# Patient Record
Sex: Male | Born: 2000 | Race: White | Hispanic: No | Marital: Married | Smoking: Current every day smoker
Health system: Southern US, Community
[De-identification: ages and names within clinical notes are randomized; demographics above are authoritative.]

## PROBLEM LIST (undated history)

## (undated) DIAGNOSIS — F909 Attention-deficit hyperactivity disorder, unspecified type: Secondary | ICD-10-CM

## (undated) DIAGNOSIS — R12 Heartburn: Secondary | ICD-10-CM

## (undated) DIAGNOSIS — M549 Dorsalgia, unspecified: Secondary | ICD-10-CM

## (undated) HISTORY — PX: OTHER SURGICAL HISTORY: SHX169

## (undated) HISTORY — DX: Attention-deficit hyperactivity disorder, unspecified type: F90.9

## (undated) HISTORY — DX: Dorsalgia, unspecified: M54.9

---
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
## 2019-09-14 IMAGING — DX LEFT WRIST - COMPLETE 3+ VIEW
2 series · 2 of 2 positions shown · non-contrast
Comparison: None.

CLINICAL DATA: Laceration to LEFT wrist. Foreign body?

EXAM:
LEFT WRIST - COMPLETE 3+ VIEW

[wrist ap]
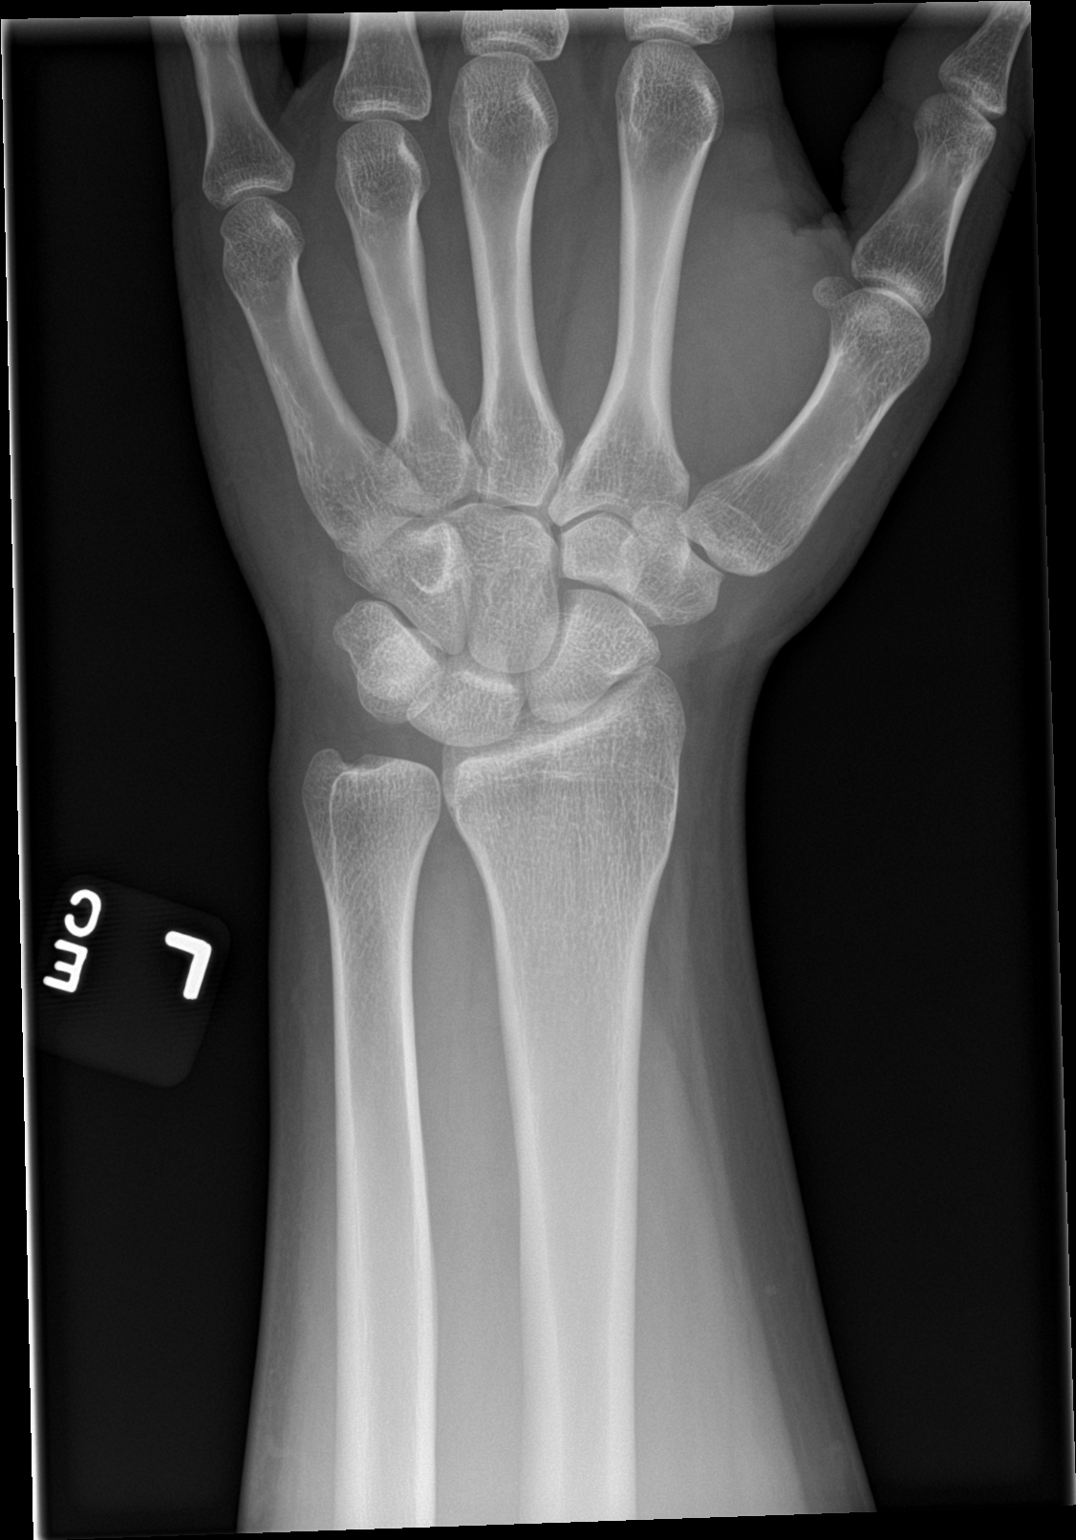

[wrist lat]
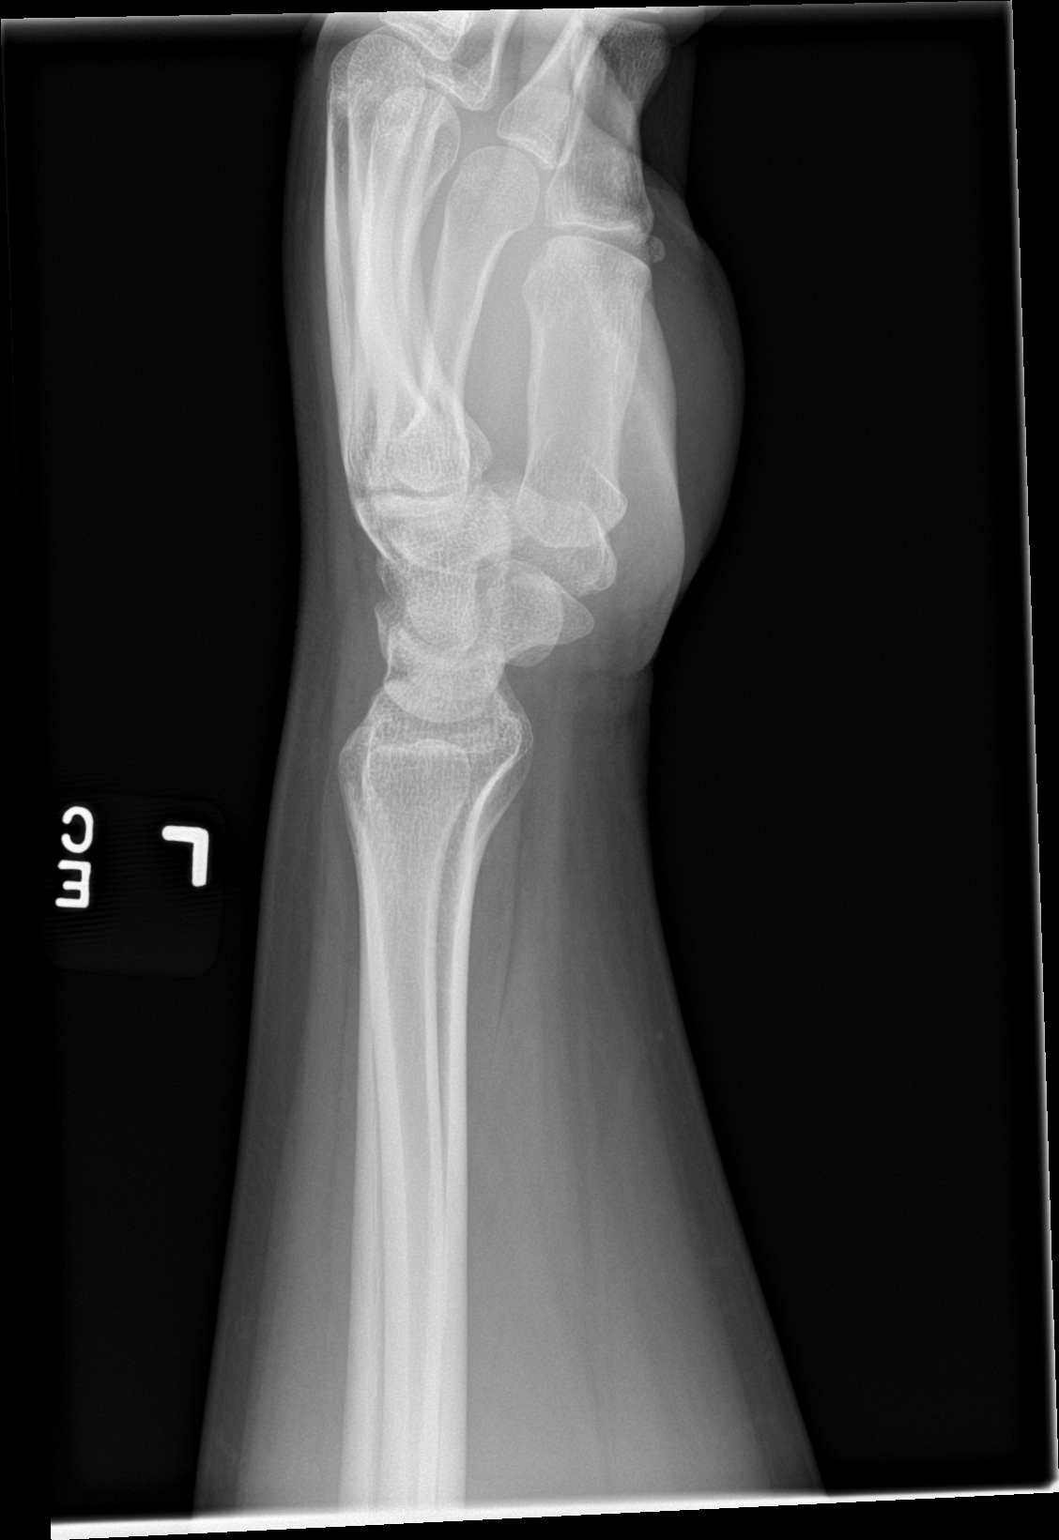

[2 of 2 positions shown; findings below may reference images not displayed]

FINDINGS: Osseous alignment is normal. No fracture line or displaced fracture
fragment seen. No radiodense foreign body appreciated within the
surrounding soft tissues.
IMPRESSION: 1. Negative.
2. No radiopaque foreign body.

## 2021-03-06 ENCOUNTER — Ambulatory Visit: Payer: Managed Care, Other (non HMO) | Admitting: Internal Medicine

## 2021-03-06 ENCOUNTER — Other Ambulatory Visit: Payer: Self-pay

## 2021-03-06 ENCOUNTER — Encounter: Payer: Self-pay | Admitting: Internal Medicine

## 2021-03-06 VITALS — BP 126/78 | HR 87 | Temp 98.2°F | Resp 16 | Ht 68.5 in | Wt 212.7 lb

## 2021-03-06 DIAGNOSIS — K219 Gastro-esophageal reflux disease without esophagitis: Secondary | ICD-10-CM

## 2021-03-06 DIAGNOSIS — B353 Tinea pedis: Secondary | ICD-10-CM

## 2021-03-06 MED ORDER — PANTOPRAZOLE SODIUM 40 MG PO TBEC: 40.0000 mg | DELAYED_RELEASE_TABLET | Freq: Every day | ORAL | 3 refills | Status: DC

## 2021-03-06 MED ORDER — ONDANSETRON HCL 8 MG PO TABS: 8.0000 mg | ORAL_TABLET | Freq: Three times a day (TID) | ORAL | 1 refills | Status: DC | PRN

## 2021-03-06 MED ORDER — TERBINAFINE HCL 1 % EX CREA: 1.0000 "application " | TOPICAL_CREAM | Freq: Two times a day (BID) | CUTANEOUS | 0 refills | Status: DC

## 2021-03-06 NOTE — Patient Instructions (Addendum)
It was great seeing you today!  Plan discussed at today's visit: -Recommend preparation H for hemorrhoids  -Pantoprazole 40 mg sent to pharmacy  -Cream for foot sent to pharmacy   Follow up in: 1 month  Take care and let us know if you have any questions or concerns prior to your next visit.  Dr. Caralee Ates   Food Choices for Gastroesophageal Reflux Disease, Adult When you have gastroesophageal reflux disease (GERD), the foods you eat and your eating habits are very important. Choosing the right foods can help ease your discomfort. Think about working with a food expert (dietitian) to help you make good choices. What are tips for following this plan? Reading food labels Look for foods that are low in saturated fat. Foods that may help with your symptoms include: Foods that have less than 5% of daily value (DV) of fat. Foods that have 0 grams of trans fat. Cooking Do not fry your food. Cook your food by baking, steaming, grilling, or broiling. These are all methods that do not need a lot of fat for cooking. To add flavor, try to use herbs that are low in spice and acidity. Meal planning Choose healthy foods that are low in fat, such as: Fruits and vegetables. Whole grains. Low-fat dairy products. Lean meats, fish, and poultry. Eat small meals often instead of eating 3 large meals each day. Eat your meals slowly in a place where you are relaxed. Avoid bending over or lying down until 2-3 hours after eating. Limit high-fat foods such as fatty meats or fried foods. Limit your intake of fatty foods, such as oils, butter, and shortening. Avoid the following as told by your doctor: Foods that cause symptoms. These may be different for different people. Keep a food diary to keep track of foods that cause symptoms. Alcohol. Drinking a lot of liquid with meals. Eating meals during the 2-3 hours before bed. Lifestyle Stay at a healthy weight. Ask your doctor what weight is healthy for you.  If you need to lose weight, work with your doctor to do so safely. Exercise for at least 30 minutes on 5 or more days each week, or as told by your doctor. Wear loose-fitting clothes. Do not smoke or use any products that contain nicotine or tobacco. If you need help quitting, ask your doctor. Sleep with the head of your bed higher than your feet. Use a wedge under the mattress or blocks under the bed frame to raise the head of the bed. Chew sugar-free gum after meals. What foods should eat? Eat a healthy, well-balanced diet of fruits, vegetables, whole grains, low-fat dairy products, lean meats, fish, and poultry. Each person is different. Foods that may cause symptoms in one person may not cause any symptoms in another person. Work with your doctor to find foods that are safe for you. The items listed above may not be a complete list of what you can eat and drink. Contact a food expert for more options. What foods should I avoid? Limiting some of these foods may help in managing the symptoms of GERD. Everyone is different. Talk with a food expert or your doctor to help you find the exact foods to avoid, if any. Fruits Any fruits prepared with added fat. Any fruits that cause symptoms. For some people, this may include citrus fruits, such as oranges, grapefruit, pineapple, and lemons. Vegetables Deep-fried vegetables. [place] fries. Any vegetables prepared with added fat. Any vegetables that cause symptoms. For some people, this  may include tomatoes and tomato products, chili peppers, onions and garlic, and horseradish. Grains Pastries or quick breads with added fat. Meats and other proteins High-fat meats, such as fatty beef or pork, hot dogs, ribs, ham, sausage, salami, and bacon. Fried meat or protein, including fried fish and fried chicken. Nuts and nut butters, in large amounts. Dairy Whole milk and chocolate milk. Sour cream. Cream. Ice cream. Cream cheese. Milkshakes. Fats and  oils Butter. Margarine. Shortening. Ghee. Beverages Coffee and tea, with or without caffeine. Carbonated beverages. Sodas. Energy drinks. Fruit juice made with acidic fruits, such as orange or grapefruit. Tomato juice. Alcoholic drinks. Sweets and desserts Chocolate and cocoa. Donuts. Seasonings and condiments Pepper. Peppermint and spearmint. Added salt. Any condiments, herbs, or seasonings that cause symptoms. For some people, this may include curry, hot sauce, or vinegar-based salad dressings. The items listed above may not be a complete list of what you should not eat and drink. Contact a food expert for more options. Questions to ask your doctor Diet and lifestyle changes are often the first steps that are taken to manage symptoms of GERD. If diet and lifestyle changes do not help, talk with your doctor about taking medicines. Where to find more information [employer] for Gastrointestinal [employer]: aboutgerd.org Summary When you have GERD, food and lifestyle choices are very important in easing your symptoms. Eat small meals often instead of 3 large meals a day. Eat your meals slowly and in a place where you are relaxed. Avoid bending over or lying down until 2-3 hours after eating. Limit high-fat foods such as fatty meats or fried foods. This information is not intended to replace advice given to you by your health care provider. Make sure you discuss any questions you have with your health care provider. Document Revised: 09/21/2019 Document Reviewed: 09/21/2019 Elsevier Patient Education  2022 [employer].

## 2021-03-06 NOTE — Progress Notes (Signed)
New Patient Office Visit  Subjective:  Patient ID: Garrett [name], male    DOB: Aug 21, 2000  Age: 20 y.o. MRN: [id number]  CC:  Chief Complaint  Patient presents with   Establish Care   Gastroesophageal Reflux    Stomach pain, vomiting in am w/ nausea   Back Pain    sciatica    HPI Garrett [name] presents as a new patient. Nauseous, vomiting dark yellow bile with burning pain, worse in the morning but does happen throughout the day, happening x 1 month. Has been taking Zofran and Promethazine, has helped nausea but not burning pain. Tried Tums, H2 blockers in the past.   Abdominal Complaint: -Duration: chronic since age 83, but worse recently -Frequency: a few times a day -Nature: bloating and burning -Location: diffuse  -Severity: mild  -Radiation: no -Alleviating factors: Nothing -Aggravating factors: Empty stomach -Treatments attempted: antacids, PPI, and H2 Blocker -Constipation: no -Diarrhea: no --Heartburn: yes -Bloating:yes -Nausea: yes -Vomiting: yes -Episodes of vomit/day: 1-2 times -Melena or hematochezia: no -Rash: no -Jaundice: no -Fever: no -Weight loss: no -Change in Appetite: yes  Also concern for skin change in between third and fourth digit on right foot, thinks it may be athlete's foot. Is itchy, been there for years, tried OTC creams but nothing has helped.   Past Medical History:  Diagnosis Date   ADHD    Back pain    Heart burn     Past Surgical History:  Procedure Laterality Date   arm surgery     left shoulder dislocation    Family History  Problem Relation Age of Onset   Diabetes Maternal Grandmother    Thyroid disease Maternal Grandfather    Alzheimer's disease Maternal Grandfather    Liver disease Paternal Grandfather     Social History   Socioeconomic History   Marital status: Single    Spouse name: Not on file   Number of children: Not on file   Years of education: Not on file   Highest education level: Not on file   Occupational History   Not on file  Tobacco Use   Smoking status: Every Day    Packs/day: 0.25    Types: Cigarettes   Smokeless tobacco: Current  Vaping Use   Vaping Use: Some days  Substance and Sexual Activity   Alcohol use: Yes    Alcohol/week: 4.0 standard drinks    Types: 4 Cans of beer per week   Drug use: No   Sexual activity: Yes  Other Topics Concern   Not on file  Social History Narrative   Not on file   Social Determinants of Health   Financial Resource Strain: Not on file  Food Insecurity: Not on file  Transportation Needs: Not on file  Physical Activity: Not on file  Stress: Not on file  Social Connections: Not on file  Intimate Partner Violence: Not on file    ROS Review of Systems  Constitutional:  Positive for appetite change. Negative for chills, fever and unexpected weight change.  Respiratory:  Negative for cough and shortness of breath.   Cardiovascular:  Negative for chest pain.  Gastrointestinal:  Positive for abdominal pain, nausea and vomiting. Negative for blood in stool, constipation and diarrhea.  Genitourinary:  Negative for dysuria and hematuria.  Skin:  Positive for color change.   Objective:   Today's Vitals: BP 126/78   Pulse 87   Temp 98.2 F (36.8 C)   Resp 16   Ht 5' 8.5" (1.74  m)   Wt 212 lb 11.2 oz (96.5 kg)   SpO2 97%   BMI 31.87 kg/m   Physical Exam Constitutional:      Appearance: Normal appearance.  HENT:     Head: Normocephalic and atraumatic.  Eyes:     Conjunctiva/sclera: Conjunctivae normal.  Cardiovascular:     Rate and Rhythm: Normal rate and regular rhythm.  Pulmonary:     Effort: Pulmonary effort is normal.     Breath sounds: Normal breath sounds.  Abdominal:     General: Bowel sounds are normal. There is no distension.     Palpations: Abdomen is soft.     Tenderness: There is no right CVA tenderness, left CVA tenderness or guarding.     Comments: Epigastric tenderness to palpation   Musculoskeletal:     Right lower leg: No edema.     Left lower leg: No edema.  Skin:    General: Skin is warm and dry.     Comments: Tinea pedis on right foot in between third and fourth digit.   Neurological:     General: No focal deficit present.     Mental Status: He is alert. Mental status is at baseline.  Psychiatric:        Mood and Affect: Mood normal.        Behavior: Behavior normal.    Assessment & Plan:   1. Tinea pedis of right foot: Lamisil cream for athlete's foot.   - terbinafine (LAMISIL AT ATHLETES FOOT) 1 % cream; Apply 1 application topically 2 (two) times daily.  Dispense: 30 g; Refill: 0  2. Gastroesophageal reflux disease, unspecified whether esophagitis present: Symptoms consistent with GERD, we will trial a PPI for 1 month. We also discussed lifestyle modification to help with symptoms. FU in 1 month, if still symptomatic despite PPI he will be referred to GI for EGD. Zofran refilled to take as needed.  - pantoprazole (PROTONIX) 40 MG tablet; Take 1 tablet (40 mg total) by mouth daily.  Dispense: 30 tablet; Refill: 3 - ondansetron (ZOFRAN) 8 MG tablet; Take 1 tablet (8 mg total) by mouth every 8 (eight) hours as needed for nausea or vomiting.  Dispense: 20 tablet; Refill: 1  Follow-up: Return in about 4 weeks (around 04/03/2021).   Margarita Mail, DO

## 2021-04-03 ENCOUNTER — Other Ambulatory Visit: Payer: Self-pay

## 2021-04-03 ENCOUNTER — Encounter: Payer: Self-pay | Admitting: Internal Medicine

## 2021-04-03 ENCOUNTER — Ambulatory Visit: Payer: Managed Care, Other (non HMO) | Admitting: Internal Medicine

## 2021-04-03 VITALS — BP 138/78 | HR 105 | Temp 98.2°F | Resp 16 | Ht 68.5 in | Wt 213.0 lb

## 2021-04-03 DIAGNOSIS — J029 Acute pharyngitis, unspecified: Secondary | ICD-10-CM

## 2021-04-03 DIAGNOSIS — K219 Gastro-esophageal reflux disease without esophagitis: Secondary | ICD-10-CM

## 2021-04-03 DIAGNOSIS — Z8659 Personal history of other mental and behavioral disorders: Secondary | ICD-10-CM

## 2021-04-03 DIAGNOSIS — R051 Acute cough: Secondary | ICD-10-CM

## 2021-04-03 DIAGNOSIS — R509 Fever, unspecified: Secondary | ICD-10-CM

## 2021-04-03 DIAGNOSIS — J020 Streptococcal pharyngitis: Secondary | ICD-10-CM

## 2021-04-03 DIAGNOSIS — R0981 Nasal congestion: Secondary | ICD-10-CM

## 2021-04-03 DIAGNOSIS — R110 Nausea: Secondary | ICD-10-CM

## 2021-04-03 LAB — POCT INFLUENZA A/B
Influenza A, POC: NEGATIVE
Influenza B, POC: NEGATIVE

## 2021-04-03 MED ORDER — FLUTICASONE PROPIONATE 50 MCG/ACT NA SUSP: 2.0000 | Freq: Every day | NASAL | 6 refills | Status: DC

## 2021-04-03 MED ORDER — METHYLPREDNISOLONE 4 MG PO TBPK: ORAL_TABLET | ORAL | 0 refills | Status: DC

## 2021-04-03 MED ORDER — BENZONATATE 100 MG PO CAPS: 100.0000 mg | ORAL_CAPSULE | Freq: Two times a day (BID) | ORAL | 0 refills | Status: DC | PRN

## 2021-04-03 NOTE — Patient Instructions (Addendum)
It was great seeing you today!  Plan discussed at today's visit: -Referral placed to GI, we will call you to set up this appointment -Medrol Dosepak sent to pharmacy, can also use Flonase (nasal steroid), Mucinex (will help with chest congestion, will make cough worse) and cough suppressants  -Referral to Psychiatry placed today for ADHD  Follow up in: 1 year  Take care and let us know if you have any questions or concerns prior to your next visit.  Dr. Rosana Berger

## 2021-04-03 NOTE — Progress Notes (Signed)
Established Patient Office Visit  Subjective:  Patient ID: Garrett [name], male    DOB: December 28, 2000  Age: 21 y.o. MRN: [id number]  CC:  Chief Complaint  Patient presents with   Follow-up    4 weeks for athletes foot and GERD   URI    Fever, vomiting, sore throat, white spots in throat, went to urgent care last Wednesday dx with viral    HPI Garrett [name] presents for follow up on GERD. At our last visit he was started on Protonix 40 mg daily. Today he states that the medication hasn't helped at all - still having yellow bile like vomit almost every morning despite being on PPI for 1 month now.  URI Compliant:  -Worst symptom: sore throat - worse with breathing, talking and swallowing x 2 weeks -Fever: yes 101 last 2 days -Cough: yes, productive thick dark green -Shortness of breath: yes -Wheezing: no -Chest pain: no -Chest tightness: no -Chest congestion: yes -Nasal congestion: yes -Runny nose: no -Post nasal drip: yes -Sore throat: yes -Sinus pressure: yes -Headache:  sometimes at night  -Face pain: yes -Ear pain: no    -Ear pressure: no    -Vomiting: yes -Fatigue: yes -Sick contacts: yes -Context: worse -Relief with OTC cold/cough medications: no  -Treatments attempted: mucinex, Dayquil   He also has a history of ADHD as a child and would like to discuss being put back on stimulants. Having a hard time concentrating at work, worse lately.   Past Medical History:  Diagnosis Date   ADHD    Back pain    Heart burn     Past Surgical History:  Procedure Laterality Date   arm surgery     left shoulder dislocation    Family History  Problem Relation Age of Onset   Diabetes Maternal Grandmother    Thyroid disease Maternal Grandfather    Alzheimer's disease Maternal Grandfather    Liver disease Paternal Grandfather     Social History   Socioeconomic History   Marital status: Single    Spouse name: Not on file   Number of children: Not on file   Years of  education: Not on file   Highest education level: Not on file  Occupational History   Not on file  Tobacco Use   Smoking status: Every Day    Packs/day: 0.25    Types: Cigarettes   Smokeless tobacco: Current  Vaping Use   Vaping Use: Some days  Substance and Sexual Activity   Alcohol use: Yes    Alcohol/week: 4.0 standard drinks    Types: 4 Cans of beer per week   Drug use: No   Sexual activity: Yes  Other Topics Concern   Not on file  Social History Narrative   Not on file   Social Determinants of Health   Financial Resource Strain: Not on file  Food Insecurity: Not on file  Transportation Needs: Not on file  Physical Activity: Not on file  Stress: Not on file  Social Connections: Not on file  Intimate Partner Violence: Not on file    Outpatient Medications Prior to Visit  Medication Sig Dispense Refill   loperamide (IMODIUM) 2 MG capsule PLEASE SEE ATTACHED FOR DETAILED DIRECTIONS     ondansetron (ZOFRAN) 8 MG tablet Take 1 tablet (8 mg total) by mouth every 8 (eight) hours as needed for nausea or vomiting. 20 tablet 1   pantoprazole (PROTONIX) 40 MG tablet Take 1 tablet (40 mg total) by mouth daily.  30 tablet 3   promethazine (PHENERGAN) 12.5 MG tablet Take 12.5 mg by mouth 4 (four) times daily.     terbinafine (LAMISIL AT ATHLETES FOOT) 1 % cream Apply 1 application topically 2 (two) times daily. 30 g 0   tiZANidine (ZANAFLEX) 4 MG tablet Take 4 mg by mouth every 6 (six) hours as needed.     No facility-administered medications prior to visit.    Allergies  Allergen Reactions   Diazepam    Vicodin [Hydrocodone-Acetaminophen]     ROS Review of Systems  Constitutional:  Positive for chills, fatigue and fever.  HENT:  Positive for congestion, postnasal drip, rhinorrhea, sinus pain and sore throat. Negative for ear pain.   Respiratory:  Positive for cough and shortness of breath. Negative for wheezing.   Cardiovascular:  Negative for chest pain and  palpitations.  Gastrointestinal:  Positive for abdominal pain, nausea and vomiting.  Neurological:  Positive for headaches. Negative for dizziness.     Objective:    Physical Exam Constitutional:      Appearance: Normal appearance.  HENT:     Head: Normocephalic and atraumatic.     Right Ear: Tympanic membrane, ear canal and external ear normal.     Left Ear: Tympanic membrane, ear canal and external ear normal.     Nose: Nose normal.     Mouth/Throat:     Mouth: Mucous membranes are moist.     Comments: PND present Eyes:     Conjunctiva/sclera: Conjunctivae normal.  Cardiovascular:     Rate and Rhythm: Normal rate and regular rhythm.  Pulmonary:     Effort: Pulmonary effort is normal.     Breath sounds: Normal breath sounds.  Musculoskeletal:     Right lower leg: No edema.     Left lower leg: No edema.  Skin:    General: Skin is warm and dry.  Neurological:     Mental Status: He is alert. Mental status is at baseline.  Psychiatric:        Mood and Affect: Mood normal.        Behavior: Behavior normal.    There were no vitals taken for this visit. Wt Readings from Last 3 Encounters:  03/06/21 212 lb 11.2 oz (96.5 kg)  12/14/16 229 lb (103.9 kg) (99 %, Z= 2.30)*   * Growth percentiles are based on CDC (Boys, 2-20 Years) data.     Health Maintenance Due  Topic Date Due   COVID-19 Vaccine (1) Never done   Pneumococcal Vaccine 40-71 Years old (1 - PCV) Never done   HPV VACCINES (1 - Male 2-dose series) Never done   HIV Screening  Never done   Hepatitis C Screening  Never done       Topic Date Due   HPV VACCINES (1 - Male 2-dose series) Never done    No results found for: TSH No results found for: WBC, HGB, HCT, MCV, PLT No results found for: NA, K, CHLORIDE, CO2, GLUCOSE, BUN, CREATININE, BILITOT, ALKPHOS, AST, ALT, PROT, ALBUMIN, CALCIUM, ANIONGAP, EGFR, GFR No results found for: CHOL No results found for: HDL No results found for: LDLCALC No results  found for: TRIG No results found for: CHOLHDL No results found for: HGBA1C    Assessment & Plan:   1. Sore throat/Fever, unspecified fever cause/Nasal congestion/Acute cough: Test for COVID, flu and strep swab today. Treat as viral URI with nasal steroid, cough suppressant and oral Medrol Dosepak due to severe sinus pressure/congestion. Follow up as  needed.  - Novel Coronavirus, NAA ([facility]) - POCT Influenza A/B - Culture, Group A Strep - methylPREDNISolone (MEDROL DOSEPAK) 4 MG TBPK tablet; Day 1: Take 8 mg (2 tablets) before breakfast, 4 mg (1 tablet) after lunch, 4 mg (1 tablet) after supper, and 8 mg (2 tablets) at bedtime. Day 2:Take 4 mg (1 tablet) before breakfast, 4 mg (1 tablet) after lunch, 4 mg (1 tablet) after supper, and 8 mg (2 tablets) at bedtime. Day 3: Take 4 mg (1 tablet) before breakfast, 4 mg (1 tablet) after lunch, 4 mg (1 tablet) after supper, and 4 mg (1 tablet) at bedtime. Day 4: Take 4 mg (1 tablet) before breakfast, 4 mg (1 tablet) after lunch, and 4 mg (1 tablet) at bedtime. Day 5: Take 4 mg (1 tablet) before breakfast and 4 mg (1 tablet) at bedtime. Day 6: Take 4 mg (1 tablet) before breakfast.  Dispense: 1 each; Refill: 0 - fluticasone (FLONASE) 50 MCG/ACT nasal spray; Place 2 sprays into both nostrils daily.  Dispense: 16 g; Refill: 6 - benzonatate (TESSALON) 100 MG capsule; Take 1 capsule (100 mg total) by mouth 2 (two) times daily as needed for cough.  Dispense: 20 capsule; Refill: 0  2. Gastroesophageal reflux disease, unspecified whether esophagitis present/Chronic nausea: States he is still vomiting everyday despite PPI trial and Zofran. Referral placed to GI for EGD.  - Ambulatory referral to Gastroenterology  3. History of ADHD: Discussed how I do not treat ADHD in adults and will place a referral for Psychiatry.   - Ambulatory referral to Psychiatry  Follow-up: Return in about 1 year (around 04/03/2022).    Teodora Medici, DO

## 2021-04-04 ENCOUNTER — Telehealth: Payer: Self-pay

## 2021-04-04 NOTE — Telephone Encounter (Signed)
error 

## 2021-04-05 ENCOUNTER — Ambulatory Visit: Payer: Self-pay | Admitting: *Deleted

## 2021-04-05 LAB — CULTURE, GROUP A STREP
MICRO NUMBER:: 12845228
SPECIMEN QUALITY:: ADEQUATE

## 2021-04-05 LAB — NOVEL CORONAVIRUS, NAA: SARS-CoV-2, NAA: NOT DETECTED

## 2021-04-05 LAB — SPECIMEN STATUS REPORT

## 2021-04-05 LAB — SARS-COV-2, NAA 2 DAY TAT

## 2021-04-05 MED ORDER — PENICILLIN V POTASSIUM 500 MG PO TABS: 500.0000 mg | ORAL_TABLET | Freq: Three times a day (TID) | ORAL | 0 refills | Status: AC

## 2021-04-05 NOTE — Addendum Note (Signed)
Addended by: Margarita Mail on: 04/05/2021 03:42 PM   Modules accepted: Orders

## 2021-04-05 NOTE — Telephone Encounter (Signed)
°  Chief Complaint: continued fever 99 today . Medication not working  Symptoms: fever, sore throat pain, white noted in back of throat  Frequency: since 04/03/21 Pertinent Negatives: Patient denies chest pain , difficulty breathing  Disposition: [] ED /[] Urgent Care (no appt availability in office) / [] Appointment(In office/virtual)/ []  [facility]/ [] Home Care/ [] Refused Recommended Disposition /[] [facility]/ [x]  Follow-up with PCP Additional Notes:  Reports he is following up with PCP to notify symptoms persist and would like to know if different medication will decrease symptoms .  Recommended if symptoms worsen go to UC/ ED. Please advise        Reason for Disposition  Fever present > 3 days (72 hours)  Answer Assessment - Initial Assessment Questions 1. TEMPERATURE: "What is the most recent temperature?"  "How was it measured?"      99 2. ONSET: "When did the fever start?"      Greater than 2 weeks ago  3. CHILLS: "Do you have chills?" If yes: "How bad are they?"  (e.g., none, mild, moderate, severe)   - NONE: no chills   - MILD: feeling cold   - MODERATE: feeling very cold, some shivering (feels better under a thick blanket)   - SEVERE: feeling extremely cold with shaking chills (general body shaking, rigors; even under a thick blanket)      na 4. OTHER SYMPTOMS: "Do you have any other symptoms besides the fever?"  (e.g., abdomen pain, cough, diarrhea, earache, headache, sore throat, urination pain)     Sore throat continues 5. CAUSE: If there are no symptoms, ask: "What do you think is causing the fever?"      na 6. CONTACTS: "Does anyone else in the family have an infection?"     Yes wife  7. TREATMENT: "What have you done so far to treat this fever?" (e.g., medications)     Taking prescribed medications see OV 04/03/21 . Prednisone , benzonatate, fluticasone propionate 8. IMMUNOCOMPROMISE: "Do you have of the following: diabetes, HIV positive,  splenectomy, cancer chemotherapy, chronic steroid treatment, transplant patient, etc."     na 9. PREGNANCY: "Is there any chance you are pregnant?" "When was your last menstrual period?"     na 10. TRAVEL: "Have you traveled out of the country in the last month?" (e.g., travel history, exposures)       na  Protocols used: [facility]

## 2021-04-11 ENCOUNTER — Other Ambulatory Visit: Payer: Self-pay | Admitting: Internal Medicine

## 2021-04-11 DIAGNOSIS — K219 Gastro-esophageal reflux disease without esophagitis: Secondary | ICD-10-CM

## 2021-04-11 NOTE — Telephone Encounter (Signed)
Requested medication (s) are due for refill today: yes to both  Requested medication (s) are on the active medication list: yes  Last refill:  03/06/21 for both  Future visit scheduled: no  Notes to clinic:  neither med delegated to NT to RF   Requested Prescriptions  Pending Prescriptions Disp Refills   ondansetron (ZOFRAN) 8 MG tablet 20 tablet     Sig: Take 1 tablet (8 mg total) by mouth every 8 (eight) hours as needed for nausea or vomiting.     Not Delegated - Gastroenterology: Antiemetics Failed - 04/11/2021  1:01 PM      Failed - This refill cannot be delegated      Passed - Valid encounter within last 6 months    Recent Outpatient Visits           1 week ago Sore throat   [facility] Margarita Mail, DO   1 month ago Gastroesophageal reflux disease, unspecified whether esophagitis present   [facility] Margarita Mail, DO               promethazine (PHENERGAN) 12.5 MG tablet 30 tablet     Sig: Take 1 tablet (12.5 mg total) by mouth 4 (four) times daily.     Not Delegated - Gastroenterology: Antiemetics Failed - 04/11/2021  1:01 PM      Failed - This refill cannot be delegated      Passed - Valid encounter within last 6 months    Recent Outpatient Visits           1 week ago Sore throat   [facility] Margarita Mail, DO   1 month ago Gastroesophageal reflux disease, unspecified whether esophagitis present   [facility] Margarita Mail, [place]

## 2021-04-11 NOTE — Telephone Encounter (Signed)
Medication Refill - Medication: ondansetron (ZOFRAN) 8 MG tablet promethazine (PHENERGAN) 12.5 MG tablet  Pt asking for a few refills, stated diagnosed with chronic nausea.  Has the patient contacted their pharmacy? Yes.   No, refills.   (Agent: If yes, when and what did the pharmacy advise?)  Preferred Pharmacy (with phone number or street name):  [id number] - [place], [place] - [address]  [address] [place] [address]  Phone: [phone] Fax: [phone]  Hours: Not open 24 hours   Has the patient been seen for an appointment in the last year OR does the patient have an upcoming appointment? Yes.    Agent: Please be advised that RX refills may take up to 3 business days. We ask that you follow-up with your pharmacy.

## 2021-04-20 ENCOUNTER — Telehealth: Payer: Self-pay | Admitting: Internal Medicine

## 2021-04-20 DIAGNOSIS — R110 Nausea: Secondary | ICD-10-CM

## 2021-04-20 DIAGNOSIS — K219 Gastro-esophageal reflux disease without esophagitis: Secondary | ICD-10-CM

## 2021-04-20 MED ORDER — ONDANSETRON HCL 8 MG PO TABS: 8.0000 mg | ORAL_TABLET | Freq: Three times a day (TID) | ORAL | 1 refills | Status: DC | PRN

## 2021-04-20 NOTE — Telephone Encounter (Signed)
Patient called and says he wants both medications prescribed. I advised again what Dr. Caralee Ates said that he shouldn't take them both. He says he will just go to another doctor to get it prescribed, he's had doctors tell him it's ok to take them both. He declined an appointment.

## 2021-04-20 NOTE — Telephone Encounter (Signed)
Requested medication (s) are due for refill today: Yes  Requested medication (s) are on the active medication list: Yes  Last refill:  03/06/21  Future visit scheduled: No  Notes to clinic:  Unable to refill per protocol, cannot delegate. This is the 2nd request, attempted to call patient.      Requested Prescriptions  Pending Prescriptions Disp Refills   promethazine (PHENERGAN) 12.5 MG tablet 30 tablet     Sig: Take 1 tablet (12.5 mg total) by mouth 4 (four) times daily.     Not Delegated - Gastroenterology: Antiemetics Failed - 04/20/2021  9:52 AM      Failed - This refill cannot be delegated      Passed - Valid encounter within last 6 months    Recent Outpatient Visits           2 weeks ago Sore throat   [facility] Teodora Medici, DO   1 month ago Gastroesophageal reflux disease, unspecified whether esophagitis present   [place], DO               ondansetron (ZOFRAN) 8 MG tablet 20 tablet 1    Sig: Take 1 tablet (8 mg total) by mouth every 8 (eight) hours as needed for nausea or vomiting.     Not Delegated - Gastroenterology: Antiemetics Failed - 04/20/2021  9:52 AM      Failed - This refill cannot be delegated      Passed - Valid encounter within last 6 months    Recent Outpatient Visits           2 weeks ago Sore throat   [place], [place]   1 month ago Gastroesophageal reflux disease, unspecified whether esophagitis present   [place], [place]

## 2021-04-20 NOTE — Telephone Encounter (Signed)
Patient called to discuss reasons for nausea medications, no answer, recording call could not be completed at this time, try your call again later.

## 2021-04-20 NOTE — Telephone Encounter (Signed)
Called pt to inform him your message, he states "I need both of them, he takes one in the day and the other one at night". Also stated he has never had that mentioned to him since he has been doing that for a while already. He mentioned he had previously spoken to you about it. I started off telling him that promethazine medication was prescribed by another provider but he states he mentioned it to you in a OV and agreed of getting refills by you Dr.Andrews.

## 2021-04-20 NOTE — Telephone Encounter (Signed)
Copied from CRM [id number]. Topic: Quick Communication - Rx Refill/Question >> Apr 20, 2021  9:45 AM Jaquita Rector A wrote: Medication: promethazine (PHENERGAN) 12.5 MG tablet, ondansetron (ZOFRAN) 8 MG tablet Patient request a call back when Rx sent please  Has the patient contacted their pharmacy? Yes.  Say they are waiting on Rx from Dr (Agent: If no, request that the patient contact the pharmacy for the refill. If patient does not wish to contact the pharmacy document the reason why and proceed with request.) (Agent: If yes, when and what did the pharmacy advise?)  Preferred Pharmacy (with phone number or street name): [id number] [place], [place] - [address]  Phone:  [phone] Fax:  [phone]    Has the patient been seen for an appointment in the last year OR does the patient have an upcoming appointment? Yes.    Agent: Please be advised that RX refills may take up to 3 business days. We ask that you follow-up with your pharmacy.

## 2021-04-21 NOTE — Telephone Encounter (Signed)
Pt.notified

## 2021-06-12 ENCOUNTER — Other Ambulatory Visit: Payer: Self-pay

## 2021-06-12 ENCOUNTER — Ambulatory Visit (INDEPENDENT_AMBULATORY_CARE_PROVIDER_SITE_OTHER): Payer: No Typology Code available for payment source | Admitting: Gastroenterology

## 2021-06-12 ENCOUNTER — Encounter: Payer: Self-pay | Admitting: Gastroenterology

## 2021-06-12 VITALS — BP 148/77 | HR 86 | Temp 99.1°F | Ht 70.0 in | Wt 215.8 lb

## 2021-06-12 DIAGNOSIS — K219 Gastro-esophageal reflux disease without esophagitis: Secondary | ICD-10-CM

## 2021-06-12 NOTE — Addendum Note (Signed)
Addended by: Linward Foster on: 06/12/2021 04:02 PM ? ? Modules accepted: Orders ? ?

## 2021-06-12 NOTE — Progress Notes (Signed)
?  ?Garrett Mood MD, MRCP(U.K) ?[address]  ?[address]  ?[place], [place] [address]  ?Main: [phone]  ?Fax: [phone] ? ? ?Gastroenterology Consultation ? ?Referring Provider:     Margarita Mail, DO ?Primary Care Physician:  Margarita Mail, DO ?Primary Gastroenterologist:  Dr. Wyline [name]  ?Reason for Consultation:     GERD ?      ? HPI:   ?Garrett [name] is a 21 y.o. y/o male referred for consultation & management  by Dr. Margarita Mail, DO.   ? ?He states that he has had acid reflux for many years since he was a kid.  His symptoms are predominantly of burning sensation in his chest which is worse when he lays down and worse particularly in the morning.  He usually has a cough the first thing in the morning.  He does take omeprazole 40 mg once a day along with food and has not noticed any benefit recently.  Of late his symptoms have been worse.  Denies any difficulty swallowing.  No family history of esophageal cancer.  No prior endoscopy.  He has lost 10 to 15 pounds intentionally.  He is a smoker. ? ? ?Past Medical History:  ?Diagnosis Date  ? ADHD   ? Back pain   ? Heart burn   ? ? ?Past Surgical History:  ?Procedure Laterality Date  ? arm surgery    ? left shoulder dislocation  ? ? ?Prior to Admission medications   ?Medication Sig Start Date End Date Taking? Authorizing Provider  ?benzonatate (TESSALON) 100 MG capsule Take 1 capsule (100 mg total) by mouth 2 (two) times daily as needed for cough. 04/03/21   Margarita Mail, DO  ?fluticasone (FLONASE) 50 MCG/ACT nasal spray Place 2 sprays into both nostrils daily. 04/03/21   Margarita Mail, DO  ?loperamide (IMODIUM) 2 MG capsule PLEASE SEE ATTACHED FOR DETAILED DIRECTIONS 02/28/21   [provider]  ?methylPREDNISolone (MEDROL DOSEPAK) 4 MG TBPK tablet Day 1: Take 8 mg (2 tablets) before breakfast, 4 mg (1 tablet) after lunch, 4 mg (1 tablet) after supper, and 8 mg (2 tablets) at bedtime. Day 2:Take 4 mg (1 tablet) before  breakfast, 4 mg (1 tablet) after lunch, 4 mg (1 tablet) after supper, and 8 mg (2 tablets) at bedtime. Day 3: Take 4 mg (1 tablet) before breakfast, 4 mg (1 tablet) after lunch, 4 mg (1 tablet) after supper, and 4 mg (1 tablet) at bedtime. Day 4: Take 4 mg (1 tablet) before breakfast, 4 mg (1 tablet) after lunch, and 4 mg (1 tablet) at bedtime. Day 5: Take 4 mg (1 tablet) before breakfast and 4 mg (1 tablet) at bedtime. Day 6: Take 4 mg (1 tablet) before breakfast. 04/03/21   Margarita Mail, DO  ?ondansetron (ZOFRAN) 8 MG tablet Take 1 tablet (8 mg total) by mouth every 8 (eight) hours as needed for nausea or vomiting. 04/20/21   Margarita Mail, DO  ?pantoprazole (PROTONIX) 40 MG tablet Take 1 tablet (40 mg total) by mouth daily. 03/06/21   Margarita Mail, DO  ?promethazine (PHENERGAN) 12.5 MG tablet Take 12.5 mg by mouth 4 (four) times daily. 02/28/21   [provider]  ?terbinafine (LAMISIL AT ATHLETES FOOT) 1 % cream Apply 1 application topically 2 (two) times daily. 03/06/21   Margarita Mail, DO  ?tiZANidine (ZANAFLEX) 4 MG tablet Take 4 mg by mouth every 6 (six) hours as needed. 02/13/21   [provider]  ? ? ?Family History  ?Problem Relation Age  of Onset  ? Diabetes Maternal Grandmother   ? Thyroid disease Maternal Grandfather   ? Alzheimer's disease Maternal Grandfather   ? Liver disease Paternal Grandfather   ?  ? ?Social History  ? ?Tobacco Use  ? Smoking status: Every Day  ?  Packs/day: 0.25  ?  Types: Cigarettes  ? Smokeless tobacco: Current  ?Vaping Use  ? Vaping Use: Some days  ?Substance Use Topics  ? Alcohol use: Yes  ?  Alcohol/week: 4.0 standard drinks  ?  Types: 4 Cans of beer per week  ? Drug use: No  ? ? ?Allergies as of 06/12/2021 - Review Complete 04/03/2021  ?Allergen Reaction Noted  ? Diazepam  03/06/2021  ? Vicodin [hydrocodone-acetaminophen]  12/14/2016  ? ? ?Review of Systems:    ?All systems reviewed and negative except where noted in HPI. ? ? Physical  Exam:  ?There were no vitals taken for this visit. ?No LMP for male patient. ?Psych:  Alert and cooperative. Normal [name] and affect. ?General:   Alert,  Well-developed, well-nourished, pleasant and cooperative in NAD ?Head:  Normocephalic and atraumatic. ?Eyes:  Sclera clear, no icterus.   Conjunctiva pink. ?Ears:  Normal auditory acuity. ?Abdomen:  Normal bowel sounds.  No bruits.  Soft, non-tender and non-distended without masses, hepatosplenomegaly or hernias noted.  No guarding or rebound tenderness.    ?Neurologic:  Alert and oriented x3;  grossly normal neurologically. ?Psych:  Alert and cooperative. Normal [name] and affect. ? ?Imaging Studies: ?No results found. ? ?Assessment and Plan:  ? ?Garrett [name] is a 21 y.o. y/o male has been referred for GERD.  Ongoing for many years with recent worsening of symptoms.  He has been taking his PPI along with meals which could explain lack of benefit. ? ?Plan ? ?GERD : Counseled on life style changes, suggest to use PPI first thing in the morning on empty stomach and eat 30 minutes after. Advised on the use of a wedge pillow at night , avoid meals for 2 hours prior to bed time. Weight loss .Discussed the risks and benefits of long term PPI use including but not limited to bone loss, chronic kidney disease, infections , low magnesium . Aim to use at the lowest dose for the shortest period of time   ?EGD to evaluate for hiatal hernia and if he does have an can discuss about surgical options as he is very young and would be best to have the root cause of the problem treated. ? ?I have discussed alternative options, risks & benefits,  which include, but are not limited to, bleeding, infection, perforation,respiratory complication & drug reaction.  The patient agrees with this plan & written consent will be obtained.   ? ? ? ?Follow up in 6-8 weeks ? ?Dr Garrett Mood MD,MRCP(U.K) ? ?

## 2021-06-12 NOTE — Patient Instructions (Signed)
Gastroesophageal Reflux Disease, Adult Gastroesophageal reflux (GER) happens when acid from the stomach flows up into the tube that connects the mouth and the stomach (esophagus). Normally, food travels down the esophagus and stays in the stomach to be digested. With GER, food and stomach acid sometimes move back up into the esophagus. You may have a disease called gastroesophageal reflux disease (GERD) if the reflux: Happens often. Causes frequent or very bad symptoms. Causes problems such as damage to the esophagus. When this happens, the esophagus becomes sore and swollen. Over time, GERD can make small holes (ulcers) in the lining of the esophagus. What are the causes? This condition is caused by a problem with the muscle between the esophagus and the stomach. When this muscle is weak or not normal, it does not close properly to keep food and acid from coming back up from the stomach. The muscle can be weak because of: Tobacco use. Pregnancy. Having a certain type of hernia (hiatal hernia). Alcohol use. Certain foods and drinks, such as coffee, chocolate, onions, and peppermint. What increases the risk? Being overweight. Having a disease that affects your connective tissue. Taking NSAIDs, such a ibuprofen. What are the signs or symptoms? Heartburn. Difficult or painful swallowing. The feeling of having a lump in the throat. A bitter taste in the mouth. Bad breath. Having a lot of saliva. Having an upset or bloated stomach. Burping. Chest pain. Different conditions can cause chest pain. Make sure you see your doctor if you have chest pain. Shortness of breath or wheezing. A long-term cough or a cough at night. Wearing away of the surface of teeth (tooth enamel). Weight loss. How is this treated? Making changes to your diet. Taking medicine. Having surgery. Treatment will depend on how bad your symptoms are. Follow these instructions at home: Eating and drinking  Follow a  diet as told by your doctor. You may need to avoid foods and drinks such as: Coffee and tea, with or without caffeine. Drinks that contain alcohol. Energy drinks and sports drinks. Bubbly (carbonated) drinks or sodas. Chocolate and cocoa. Peppermint and mint flavorings. Garlic and onions. Horseradish. Spicy and acidic foods. These include peppers, chili powder, curry powder, vinegar, hot sauces, and BBQ sauce. Citrus fruit juices and citrus fruits, such as oranges, lemons, and limes. Tomato-based foods. These include red sauce, chili, salsa, and pizza with red sauce. Fried and fatty foods. These include donuts, french fries, potato chips, and high-fat dressings. High-fat meats. These include hot dogs, rib eye steak, sausage, ham, and bacon. High-fat dairy items, such as whole milk, butter, and cream cheese. Eat small meals often. Avoid eating large meals. Avoid drinking large amounts of liquid with your meals. Avoid eating meals during the 2-3 hours before bedtime. Avoid lying down right after you eat. Do not exercise right after you eat. Lifestyle  Do not smoke or use any products that contain nicotine or tobacco. If you need help quitting, ask your doctor. Try to lower your stress. If you need help doing this, ask your doctor. If you are overweight, lose an amount of weight that is healthy for you. Ask your doctor about a safe weight loss goal. General instructions Pay attention to any changes in your symptoms. Take over-the-counter and prescription medicines only as told by your doctor. Do not take aspirin, ibuprofen, or other NSAIDs unless your doctor says it is okay. Wear loose clothes. Do not wear anything tight around your waist. Raise (elevate) the head of your bed about 6   inches (15 cm). You may need to use a wedge to do this. Avoid bending over if this makes your symptoms worse. Keep all follow-up visits. Contact a doctor if: You have new symptoms. You lose weight and you  do not know why. You have trouble swallowing or it hurts to swallow. You have wheezing or a cough that keeps happening. You have a hoarse voice. Your symptoms do not get better with treatment. Get help right away if: You have sudden pain in your arms, neck, jaw, teeth, or back. You suddenly feel sweaty, dizzy, or light-headed. You have chest pain or shortness of breath. You vomit and the vomit is green, yellow, or black, or it looks like blood or coffee grounds. You faint. Your poop (stool) is red, bloody, or black. You cannot swallow, drink, or eat. These symptoms may represent a serious problem that is an emergency. Do not wait to see if the symptoms will go away. Get medical help right away. Call your local emergency services (911 in the [place] Do not drive yourself to the hospital. Summary If a person has gastroesophageal reflux disease (GERD), food and stomach acid move back up into the esophagus and cause symptoms or problems such as damage to the esophagus. Treatment will depend on how bad your symptoms are. Follow a diet as told by your doctor. Take all medicines only as told by your doctor. This information is not intended to replace advice given to you by your health care provider. Make sure you discuss any questions you have with your health care provider. Document Revised: 09/21/2019 Document Reviewed: 09/21/2019 Elsevier Patient Education  2022 Elsevier Inc.  

## 2021-06-20 ENCOUNTER — Encounter: Payer: Self-pay | Admitting: Certified Registered Nurse Anesthetist

## 2021-06-20 ENCOUNTER — Ambulatory Visit
Admission: RE | Admit: 2021-06-20 | Payer: Managed Care, Other (non HMO) | Source: Home / Self Care | Admitting: Gastroenterology

## 2021-06-20 ENCOUNTER — Encounter: Admission: RE | Payer: Self-pay | Source: Home / Self Care

## 2021-06-20 SURGERY — ESOPHAGOGASTRODUODENOSCOPY (EGD) WITH PROPOFOL
Anesthesia: General

## 2021-11-13 ENCOUNTER — Telehealth: Payer: Self-pay | Admitting: Internal Medicine

## 2021-11-13 NOTE — Telephone Encounter (Signed)
Copied from CRM [id number]. Topic: Complaint - Billing/Coding >> Nov 13, 2021  9:36 AM Franchot Heidelberg wrote: DOS: 04/03/2021 Details of complaint: Pt's wife called to report that this bill should have been filed under [place]. She states they have received a bill with the [place] uninsured discount applied. Says the patient paid a copay for his visit. Please advise  How would the patient like to see this issue resolved?   Call back request from practice admin. States that insurance was not filed for this visit.   [phone]    Route to Research officer, political party.

## 2021-11-13 NOTE — Telephone Encounter (Signed)
Verified that wife was on patient DPR before calling.  Informed wife that per [place] patient's coverage was not active until 04/05/2021 and that patient is responsible for balance since date of service was on 04/03/21. Pt's wife verbalized understanding.

## 2022-08-01 ENCOUNTER — Emergency Department: Payer: Managed Care, Other (non HMO)

## 2022-08-01 ENCOUNTER — Emergency Department
Admission: EM | Admit: 2022-08-01 | Discharge: 2022-08-02 | Disposition: A | Discharge status: Home / Self Care | Payer: Managed Care, Other (non HMO) | Attending: Emergency Medicine | Admitting: Emergency Medicine

## 2022-08-01 ENCOUNTER — Encounter: Payer: Self-pay | Admitting: Emergency Medicine

## 2022-08-01 ENCOUNTER — Other Ambulatory Visit: Payer: Self-pay

## 2022-08-01 DIAGNOSIS — X58XXXA Exposure to other specified factors, initial encounter: Secondary | ICD-10-CM | POA: Diagnosis not present

## 2022-08-01 DIAGNOSIS — S43014A Anterior dislocation of right humerus, initial encounter: Secondary | ICD-10-CM | POA: Insufficient documentation

## 2022-08-01 DIAGNOSIS — S4991XA Unspecified injury of right shoulder and upper arm, initial encounter: Secondary | ICD-10-CM | POA: Diagnosis present

## 2022-08-01 MED ORDER — OXYCODONE HCL 5 MG PO TABS
5.0000 mg | ORAL_TABLET | Freq: Once | ORAL | Status: AC
  Administered 2022-08-01: 5 mg via ORAL
  Filled 2022-08-01: qty 1

## 2022-08-01 MED ORDER — KETOROLAC TROMETHAMINE 30 MG/ML IJ SOLN
30.0000 mg | Freq: Once | INTRAMUSCULAR | Status: AC
  Administered 2022-08-01: 30 mg via INTRAMUSCULAR
  Filled 2022-08-01: qty 1

## 2022-08-01 MED ORDER — LIDOCAINE-EPINEPHRINE 2 %-1:100000 IJ SOLN
20.0000 mL | Freq: Once | INTRAMUSCULAR | Status: DC
  Filled 2022-08-01: qty 1

## 2022-08-01 NOTE — ED Provider Notes (Signed)
[facility] Provider Note    Event Date/Time   First MD Initiated Contact with Patient 08/01/22 2314     (approximate)   History   Shoulder Injury   HPI  Garrett [name] is a 22 y.o. male who presents to the ED for evaluation of Shoulder Injury   Right-hand-dominant patient presents to the ED for the ration of right shoulder dislocation after awakening from a bad dream.  He reports he has remotely dislocated the same shoulder 1 time in the past.  Reports awakening from a bad dream flailing his right arm and causing dislocation just prior to arrival.  He asked me to replace it immediately and refuses medications.   Physical Exam   Triage Vital Signs: ED Triage Vitals [08/01/22 2314]  Enc Vitals Group     BP      Pulse      Resp      Temp      Temp src      SpO2      Weight 210 lb (95.3 kg)     Height 5\' 10"  (1.778 m)     Head Circumference      Peak Flow      Pain Score 10     Pain Loc      Pain Edu?      Excl. in GC?     Most recent vital signs: Vitals:   08/01/22 2349 08/01/22 2349  BP:  (!) 162/74  Pulse: 66   Resp:    Temp:    SpO2: 98%     General: Awake, no distress.  CV:  Good peripheral perfusion.  Resp:  Normal effort.  Abd:  No distention.  MSK:  Closed deformity consistent with dislocation to the right shoulder, right arm is distally neurovascularly intact. Neuro:  No focal deficits appreciated. Other:     ED Results / Procedures / Treatments   Labs (all labs ordered are listed, but only abnormal results are displayed) Labs Reviewed - No data to display  EKG   RADIOLOGY Plain film of the right shoulder interpreted by me with evidence of an anterior dislocation Plain film of the right shoulder interpreted by me with reduction  Official radiology report(s): DG Shoulder Right Portable  Result Date: 08/01/2022 CLINICAL DATA:  Shoulder dislocation, postreduction. EXAM: RIGHT SHOULDER - 1 VIEW COMPARISON:  Pre  reduction radiographs earlier today. FINDINGS: The previous anterior shoulder dislocation has been reduced. No convincing Hill-Sachs impaction injury or bony [place]. The acromioclavicular joint is congruent IMPRESSION: Reduction of previous anterior shoulder dislocation. Electronically Signed   By: Narda Rutherford M.D.   On: 08/01/2022 23:44   DG Shoulder Right  Result Date: 08/01/2022 CLINICAL DATA:  Right shoulder dislocation. EXAM: RIGHT SHOULDER - 2+ VIEW COMPARISON:  Right shoulder radiograph dated 01/13/2016. FINDINGS: Anterior dislocation the right shoulder. Possible impaction of the humeral head over the anterior bony glenoid. Apparent focal indication of the cortex of the superolateral humeral head may be artifactual or represent a Hill-Sachs injury. The soft tissues are unremarkable. IMPRESSION: Anterior dislocation of the right shoulder. Electronically Signed   By: Elgie Collard M.D.   On: 08/01/2022 23:17    PROCEDURES and INTERVENTIONS:  Reduction of dislocation  Date/Time: 08/02/2022 5:50 AM  Performed by: Delton Prairie, MD Authorized by: Delton Prairie, MD  Consent: Verbal consent obtained. Risks and benefits: risks, benefits and alternatives were discussed Consent given by: patient Patient tolerance: patient tolerated the procedure well with no immediate  complications Comments: With reassurance and breathing techniques, I externally rotate and abduct the right arm with a palpable clunk and reduction.  Confirmed by x-ray.     Medications  ketorolac (TORADOL) 30 MG/ML injection 30 mg (30 mg Intramuscular Given 08/01/22 2324)  oxyCODONE (Oxy IR/ROXICODONE) immediate release tablet 5 mg (5 mg Oral Given 08/01/22 2346)     IMPRESSION / MDM / ASSESSMENT AND PLAN / ED COURSE  I reviewed the triage vital signs and the nursing notes.  Differential diagnosis includes, but is not limited to, fracture, location, muscular strain or spasm  22 year old presents to the ED with an anterior  shoulder dislocation reduced at the bedside and suitable for outpatient management with immobilization and orthopedic follow-up.  X-ray confirms anterior dislocation.  Reduced at the bedside without much difficulty and not requiring medications.  Follow-up x-ray confirms reduction without clear signs of any associated fracture.  He is placed in a sling and discharged with orthopedic referral.   Clinical Course as of 08/02/22 0549  Wed Aug 01, 2022  2321 Shoulder reduced at the bedside without medications per patient's request.  Well-tolerated.  Nurses getting a sling and some Toradol.  Repeat x-ray pending [DS]    Clinical Course User Index [DS] Delton Prairie, MD     FINAL CLINICAL IMPRESSION(S) / ED DIAGNOSES   Final diagnoses:  Anterior dislocation of right shoulder, initial encounter     Rx / DC Orders   ED Discharge Orders     None        Note:  This document was prepared using Dragon voice recognition software and may include unintentional dictation errors.   Delton Prairie, MD 08/02/22 [phone]

## 2022-08-01 NOTE — Discharge Instructions (Signed)
Please take Tylenol and ibuprofen/Advil for your pain.  It is safe to take them together, or to alternate them every few hours.  Take up to 1000mg  of Tylenol at a time, up to 4 times per day.  Do not take more than 4000 mg of Tylenol in 24 hours.  For ibuprofen, take 400-600 mg, 3 - 4 times per day.  Use the oxycodone as needed for more severe/breakthrough pain, or to help you sleep.  Keep your right arm in the sling at all times, even while sleeping.  You may briefly take it off to change clothes or shower but it is very important that you do not "chicken wing" that right elbow out, please keep a very close to the body so it does not dislocate again  Follow-up with the orthopedic doctor in the clinic in the next 1-2 weeks to get cleared from the sling.

## 2022-08-01 NOTE — ED Triage Notes (Signed)
Patient ambulatory to triage with steady gait, without difficulty, appears uncomfortable; reports that he injured his rt shoulder while sleeping; hx dislocation

## 2022-08-06 ENCOUNTER — Telehealth: Payer: Self-pay | Admitting: Student

## 2022-08-06 ENCOUNTER — Encounter (HOSPITAL_BASED_OUTPATIENT_CLINIC_OR_DEPARTMENT_OTHER): Payer: Self-pay | Admitting: Student

## 2022-08-06 ENCOUNTER — Other Ambulatory Visit (HOSPITAL_BASED_OUTPATIENT_CLINIC_OR_DEPARTMENT_OTHER): Payer: Self-pay

## 2022-08-06 ENCOUNTER — Ambulatory Visit (HOSPITAL_BASED_OUTPATIENT_CLINIC_OR_DEPARTMENT_OTHER): Payer: Managed Care, Other (non HMO) | Admitting: Student

## 2022-08-06 DIAGNOSIS — M24411 Recurrent dislocation, right shoulder: Secondary | ICD-10-CM | POA: Diagnosis not present

## 2022-08-06 MED ORDER — METHOCARBAMOL 500 MG PO TABS
500.0000 mg | ORAL_TABLET | Freq: Four times a day (QID) | ORAL | 0 refills | Status: AC
  Filled 2022-08-06: qty 40, 10d supply, fill #0

## 2022-08-06 NOTE — Telephone Encounter (Signed)
Patient spouse came in to drop off disability paperwork, fill out Auth forms and pay $25 in cash for [employer]
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
## 2022-08-08 ENCOUNTER — Encounter (HOSPITAL_BASED_OUTPATIENT_CLINIC_OR_DEPARTMENT_OTHER): Payer: Self-pay | Admitting: Student

## 2022-08-08 ENCOUNTER — Other Ambulatory Visit (HOSPITAL_BASED_OUTPATIENT_CLINIC_OR_DEPARTMENT_OTHER): Payer: Self-pay | Admitting: Student

## 2022-08-08 ENCOUNTER — Encounter (HOSPITAL_BASED_OUTPATIENT_CLINIC_OR_DEPARTMENT_OTHER): Payer: Self-pay

## 2022-08-10 ENCOUNTER — Encounter (HOSPITAL_BASED_OUTPATIENT_CLINIC_OR_DEPARTMENT_OTHER): Payer: Self-pay | Admitting: Student

## 2022-08-24 ENCOUNTER — Encounter (HOSPITAL_BASED_OUTPATIENT_CLINIC_OR_DEPARTMENT_OTHER): Payer: Self-pay | Admitting: Student

## 2022-08-26 ENCOUNTER — Encounter (HOSPITAL_BASED_OUTPATIENT_CLINIC_OR_DEPARTMENT_OTHER): Payer: Self-pay

## 2022-08-27 ENCOUNTER — Other Ambulatory Visit: Payer: Managed Care, Other (non HMO)

## 2022-08-29 ENCOUNTER — Other Ambulatory Visit: Payer: Self-pay | Admitting: Internal Medicine

## 2022-09-03 ENCOUNTER — Ambulatory Visit (HOSPITAL_BASED_OUTPATIENT_CLINIC_OR_DEPARTMENT_OTHER): Payer: Managed Care, Other (non HMO) | Admitting: Student

## 2022-11-15 ENCOUNTER — Other Ambulatory Visit: Payer: Self-pay

## 2022-11-15 ENCOUNTER — Telehealth: Payer: Self-pay | Admitting: Internal Medicine

## 2022-11-15 ENCOUNTER — Emergency Department: Payer: Medicaid Other

## 2022-11-15 ENCOUNTER — Encounter: Payer: Self-pay | Admitting: Emergency Medicine

## 2022-11-15 ENCOUNTER — Emergency Department
Admission: EM | Admit: 2022-11-15 | Discharge: 2022-11-15 | Disposition: A | Discharge status: Home / Self Care | Payer: Self-pay | Attending: Emergency Medicine | Admitting: Emergency Medicine

## 2022-11-15 DIAGNOSIS — I10 Essential (primary) hypertension: Secondary | ICD-10-CM | POA: Insufficient documentation

## 2022-11-15 DIAGNOSIS — S8255XA Nondisplaced fracture of medial malleolus of left tibia, initial encounter for closed fracture: Secondary | ICD-10-CM | POA: Insufficient documentation

## 2022-11-15 DIAGNOSIS — W208XXA Other cause of strike by thrown, projected or falling object, initial encounter: Secondary | ICD-10-CM | POA: Insufficient documentation

## 2022-11-15 MED ORDER — KETOROLAC TROMETHAMINE 15 MG/ML IJ SOLN
15.0000 mg | Freq: Once | INTRAMUSCULAR | Status: AC
  Administered 2022-11-15: 15 mg via INTRAMUSCULAR
  Filled 2022-11-15: qty 1
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
## 2022-11-15 NOTE — Telephone Encounter (Signed)
A user error has taken place: encounter opened in error, closed for administrative reasons.
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
## 2022-11-15 NOTE — Discharge Instructions (Signed)
You have a fracture through your medial malleolus.  This was discussed with the podiatrist who manages these fractures.  Please keep your leg elevated is much as you can.  Keep your splint clean and dry.  Do not bear weight.  Please call the podiatrist today for an appointment next week.  Please return for any new, worsening, or change in symptoms or other concerns.  It was a pleasure caring for you today.

## 2022-11-15 NOTE — Telephone Encounter (Signed)
Copied from CRM [id number]. Topic: Appointment Scheduling - Scheduling Inquiry for Clinic >> Nov 15, 2022  1:36 PM Phill Myron wrote: Attn: Clare Gandy Mr. Korbel is asking for you to call back, he was on a call with his job.

## 2022-11-15 NOTE — Telephone Encounter (Signed)
Appt scheduled

## 2022-11-16 ENCOUNTER — Encounter (HOSPITAL_BASED_OUTPATIENT_CLINIC_OR_DEPARTMENT_OTHER): Payer: Self-pay

## 2022-11-19 ENCOUNTER — Encounter: Payer: Self-pay | Admitting: Internal Medicine

## 2022-11-19 ENCOUNTER — Ambulatory Visit (INDEPENDENT_AMBULATORY_CARE_PROVIDER_SITE_OTHER): Payer: Self-pay | Admitting: Internal Medicine

## 2022-11-19 NOTE — Progress Notes (Signed)
Patient not seen.
